# Patient Record
Sex: Female | Born: 1950 | Race: White | Hispanic: No | Smoking: Never smoker
Health system: Southern US, Community
[De-identification: ages and names within clinical notes are randomized; demographics above are authoritative.]

## PROBLEM LIST (undated history)

## (undated) DIAGNOSIS — I10 Essential (primary) hypertension: Secondary | ICD-10-CM

## (undated) DIAGNOSIS — Z87442 Personal history of urinary calculi: Secondary | ICD-10-CM

## (undated) DIAGNOSIS — J302 Other seasonal allergic rhinitis: Secondary | ICD-10-CM

## (undated) DIAGNOSIS — K219 Gastro-esophageal reflux disease without esophagitis: Secondary | ICD-10-CM

## (undated) DIAGNOSIS — R600 Localized edema: Secondary | ICD-10-CM

## (undated) HISTORY — PX: CYSTOURETHROSCOPY: SHX476

## (undated) HISTORY — PX: MELANOMA EXCISION: SHX5266

## (undated) HISTORY — PX: CHOLECYSTECTOMY: SHX55

## (undated) HISTORY — PX: EYE SURGERY: SHX253

## (undated) HISTORY — PX: BLEPHAROPLASTY: SUR158

---

## 2008-09-02 ENCOUNTER — Emergency Department: Payer: Self-pay | Admitting: Internal Medicine

## 2008-09-28 ENCOUNTER — Ambulatory Visit: Payer: Self-pay | Admitting: Internal Medicine

## 2008-10-06 ENCOUNTER — Ambulatory Visit: Payer: Self-pay | Admitting: Internal Medicine

## 2009-04-13 ENCOUNTER — Ambulatory Visit: Payer: Self-pay | Admitting: Internal Medicine

## 2010-05-24 ENCOUNTER — Ambulatory Visit: Payer: Self-pay | Admitting: Internal Medicine

## 2011-01-13 ENCOUNTER — Ambulatory Visit: Payer: Self-pay | Admitting: Internal Medicine

## 2011-06-11 ENCOUNTER — Emergency Department: Payer: Self-pay | Admitting: Emergency Medicine

## 2011-06-17 ENCOUNTER — Ambulatory Visit: Payer: Self-pay | Admitting: Urology

## 2011-06-18 ENCOUNTER — Ambulatory Visit: Payer: Self-pay | Admitting: Urology

## 2011-06-19 ENCOUNTER — Ambulatory Visit: Payer: Self-pay | Admitting: Urology

## 2011-06-20 ENCOUNTER — Ambulatory Visit: Payer: Self-pay | Admitting: Urology

## 2011-06-24 ENCOUNTER — Ambulatory Visit: Payer: Self-pay | Admitting: Urology

## 2011-07-02 ENCOUNTER — Ambulatory Visit: Payer: Self-pay | Admitting: Urology

## 2014-12-13 ENCOUNTER — Ambulatory Visit: Payer: Self-pay | Admitting: Internal Medicine

## 2015-01-01 ENCOUNTER — Ambulatory Visit: Payer: Self-pay | Admitting: Internal Medicine

## 2016-08-15 ENCOUNTER — Other Ambulatory Visit: Payer: Self-pay | Admitting: Family Medicine

## 2016-08-15 DIAGNOSIS — Z780 Asymptomatic menopausal state: Secondary | ICD-10-CM

## 2016-09-08 ENCOUNTER — Ambulatory Visit
Admission: RE | Admit: 2016-09-08 | Discharge: 2016-09-08 | Disposition: A | Discharge status: Home / Self Care | Payer: Medicare Other | Source: Ambulatory Visit | Attending: Family Medicine | Admitting: Family Medicine

## 2016-09-08 ENCOUNTER — Encounter: Payer: Self-pay | Admitting: Radiology

## 2016-09-08 DIAGNOSIS — Z780 Asymptomatic menopausal state: Secondary | ICD-10-CM | POA: Diagnosis present

## 2019-02-22 ENCOUNTER — Other Ambulatory Visit: Payer: Self-pay | Admitting: Medical Oncology

## 2019-02-22 ENCOUNTER — Ambulatory Visit
Admission: RE | Admit: 2019-02-22 | Discharge: 2019-02-22 | Disposition: A | Discharge status: Home / Self Care | Payer: Medicare Other | Source: Ambulatory Visit | Attending: Medical Oncology | Admitting: Medical Oncology

## 2019-02-22 DIAGNOSIS — Z1231 Encounter for screening mammogram for malignant neoplasm of breast: Secondary | ICD-10-CM

## 2020-11-13 DIAGNOSIS — K219 Gastro-esophageal reflux disease without esophagitis: Secondary | ICD-10-CM | POA: Insufficient documentation

## 2022-02-17 ENCOUNTER — Other Ambulatory Visit: Payer: Self-pay | Admitting: Family Medicine

## 2022-02-17 DIAGNOSIS — I10 Essential (primary) hypertension: Secondary | ICD-10-CM | POA: Insufficient documentation

## 2022-02-17 DIAGNOSIS — Z780 Asymptomatic menopausal state: Secondary | ICD-10-CM

## 2022-03-25 ENCOUNTER — Other Ambulatory Visit: Payer: Self-pay | Admitting: Family Medicine

## 2022-03-25 DIAGNOSIS — Z1231 Encounter for screening mammogram for malignant neoplasm of breast: Secondary | ICD-10-CM

## 2022-05-20 ENCOUNTER — Other Ambulatory Visit: Payer: Medicare Other

## 2022-06-04 ENCOUNTER — Ambulatory Visit
Admission: RE | Admit: 2022-06-04 | Discharge: 2022-06-04 | Disposition: A | Discharge status: Home / Self Care | Payer: Medicare Other | Source: Ambulatory Visit | Attending: Family Medicine | Admitting: Family Medicine

## 2022-06-04 DIAGNOSIS — Z1231 Encounter for screening mammogram for malignant neoplasm of breast: Secondary | ICD-10-CM | POA: Insufficient documentation

## 2022-06-04 DIAGNOSIS — Z780 Asymptomatic menopausal state: Secondary | ICD-10-CM | POA: Insufficient documentation

## 2022-09-09 ENCOUNTER — Ambulatory Visit
Admission: EM | Admit: 2022-09-09 | Discharge: 2022-09-09 | Disposition: A | Discharge status: Home / Self Care | Payer: Medicare Other

## 2022-09-09 DIAGNOSIS — L247 Irritant contact dermatitis due to plants, except food: Secondary | ICD-10-CM

## 2022-09-09 MED ORDER — PREDNISONE 10 MG (21) PO TBPK: ORAL_TABLET | ORAL | 0 refills | Status: DC

## 2022-09-09 NOTE — Discharge Instructions (Addendum)
Take the prednisone according to the package instructions.  Use over-the-counter Allegra, Claritin, or Zyrtec during the day as needed for itching and use Benadryl 50 mg at bedtime.  This may also help you sleep as a steroids may interrupt your sleep cycle.  Contact your eye surgeon to let him know you are being put on Prednisone for contact dermatitis around your eye.   Hold your Meloxicam while on Prednisone.  For facial lesions, if you develop any changes in your vision or itching and irritation in your eyes please go to the ER for evaluation or follow-up with ophthalmology.

## 2022-09-09 NOTE — ED Provider Notes (Signed)
[facility]    CSN: [id number] Arrival date & time: 09/09/22  0945      History   Chief Complaint Chief Complaint  Patient presents with   Rash    HPI Autumn Fernandez is a 71 y.o. female.   HPI  71 year old female here for evaluation of rash around her left eye.  Patient reports that she was working in her yard on Saturday when she came in contact with some poison oak or poison ivy.  When she woke up Monday morning she had a rash around her left eye that was itchy.  She states that she then developed swelling to the area above and below her eye.  She was evaluated at [facility] urgent care yesterday and prescribed 1% cortisone cream which has not been helping.  She was also examined by her optometrist yesterday and was told she needs a prednisone pack but he was reluctant to prescribe it as he did not know her past medical history.  Patient is scheduled to have cataract surgery in her left eye on September 24, 2022.  History reviewed. No pertinent past medical history.  There are no problems to display for this patient.   History reviewed. No pertinent surgical history.  OB History   No obstetric history on file.      Home Medications    Prior to Admission medications   Medication Sig Start Date End Date Taking? Authorizing Provider  meloxicam (MOBIC) 15 MG tablet Take 1 tablet by mouth daily. 02/13/21  Yes [provider]  predniSONE (STERAPRED UNI-PAK 21 TAB) 10 MG (21) TBPK tablet Take 6 tablets x 2 days, 5 tablets x 2 days, 4 tablets x 2 days, 3 tablets x 2 days, 2 tablets x 2 days, 1 tablet x 2 days 09/09/22  Yes Margarette Canada, NP    Family History Family History  Problem Relation Age of Onset   Breast cancer Sister     Social History Social History   Tobacco Use   Smoking status: Never   Smokeless tobacco: Never  Vaping Use   Vaping Use: Never used  Substance Use Topics   Alcohol use: Never   Drug use: Never     Allergies   Patient  has no allergy information on record.   Review of Systems Review of Systems  Eyes:  Positive for visual disturbance. Negative for photophobia, pain, discharge, redness and itching.  Skin:  Positive for color change and rash.     Physical Exam Triage Vital Signs ED Triage Vitals  Enc Vitals Group     BP --      Pulse --      Resp --      Temp --      Temp Source 09/09/22 1025 Oral     SpO2 --      Weight 09/09/22 1024 254 lb (115.2 kg)     Height 09/09/22 1024 5' (1.524 m)     Head Circumference --      Peak Flow --      Pain Score 09/09/22 1021 0     Pain Loc --      Pain Edu? --      Excl. in [place]? --    No data found.  Updated Vital Signs BP (!) 159/80 (BP Location: Left Arm)   Resp 20   Ht 5' (1.524 m)   Wt 254 lb (115.2 kg)   BMI 49.61 kg/m   Visual Acuity Right Eye Distance:  20/70 Left Eye Distance: 20/40 Bilateral Distance: 20/25  Right Eye Near:   Left Eye Near:    Bilateral Near:   (Pt wears Prescription Glasses)  Physical Exam Vitals and nursing note reviewed.  Constitutional:      Appearance: Normal appearance. She is not ill-appearing.  HENT:     Head: Normocephalic and atraumatic.  Eyes:     General: No scleral icterus.       Right eye: No discharge.        Left eye: No discharge.     Extraocular Movements: Extraocular movements intact.     Conjunctiva/sclera: Conjunctivae normal.     Pupils: Pupils are equal, round, and reactive to light.  Skin:    General: Skin is warm and dry.     Capillary Refill: Capillary refill takes less than 2 seconds.     Findings: Erythema and rash present.  Neurological:     General: No focal deficit present.     Mental Status: She is alert and oriented to person, place, and time.  Psychiatric:        Mood and Affect: Mood normal.        Behavior: Behavior normal.        Thought Content: Thought content normal.        Judgment: Judgment normal.      UC Treatments / Results  Labs (all labs ordered are  listed, but only abnormal results are displayed) Labs Reviewed - No data to display  EKG   Radiology No results found.  Procedures Procedures (including critical care time)  Medications Ordered in UC Medications - No data to display  Initial Impression / Assessment and Plan / UC Course  I have reviewed the triage vital signs and the nursing notes.  Pertinent labs & imaging results that were available during my care of the patient were reviewed by me and considered in my medical decision making (see chart for details).   Patient is a pleasant, nontoxic-appearing 71 year old female here for evaluation of contact of otitis rash around her left eye that has been present for last 2 days.  She was seen by both [facility] urgent care and her optometrist yesterday, both of which confirmed it was contact dermatitis due to poison oak or poison ivy.  She was prescribed a steroid cream by the urgent care but her optometrist told her that she needs to have oral prednisone but he was reluctant to prescribe it as he did not know her past medical history.    The rash is erythematous base with some mild yellow crusty discharge.  There are some scattered lesions on the left side of her nose and traveling down her left cheek.  There is also swelling to the infraorbital region of her left eye.  Her bulbar and labral conjunctiva are unremarkable.  Her pupils equal round reactive and her EOM is intact.  There are some mild puffiness to her left upper eyelid with some scaly skin but I do not appreciate any lesions or drainage.  Her exam is consistent with contact dermatitis.  I will place her on a 12-day prednisone taper.  I have advised her that she needs to contact her ophthalmologist in [place] who will be doing her cataract surgery to let her know that she has contact dermatitis around her eye and is going to be on steroids.  I have also advised her that if she develops any changes in her vision that she needs to  see her eye  doctor for reevaluation.  Her visual acuity is OU 20/25, OD 20/70, OS 20/40.   Final Clinical Impressions(s) / UC Diagnoses   Final diagnoses:  Irritant contact dermatitis due to plants, except food     Discharge Instructions      Take the prednisone according to the package instructions.  Use over-the-counter Allegra, Claritin, or Zyrtec during the day as needed for itching and use Benadryl 50 mg at bedtime.  This may also help you sleep as a steroids may interrupt your sleep cycle.  Contact your eye surgeon to let him know you are being put on Prednisone for contact dermatitis around your eye.   Hold your Meloxicam while on Prednisone.  For facial lesions, if you develop any changes in your vision or itching and irritation in your eyes please go to the ER for evaluation or follow-up with ophthalmology.      ED Prescriptions     Medication Sig Dispense Auth. Provider   predniSONE (STERAPRED UNI-PAK 21 TAB) 10 MG (21) TBPK tablet Take 6 tablets x 2 days, 5 tablets x 2 days, 4 tablets x 2 days, 3 tablets x 2 days, 2 tablets x 2 days, 1 tablet x 2 days 42 tablet Becky Augusta, NP      PDMP not reviewed this encounter.   Becky Augusta, NP 09/09/22 1050

## 2022-09-09 NOTE — ED Triage Notes (Signed)
Pt c/o rash x2days. Pt was working outside Saturday and believes that she got it then.   Pt went to a walk in clinic in [place] and was told that it is Edema and that she needs to use Cortisone Cream. Pt states that that did not help the itching.   Pt went to her eye doctor for blurry vision and was told that she has [facility] along the eye but they refuse to prescribe steroids due to lack of medical history.

## 2023-06-17 ENCOUNTER — Other Ambulatory Visit: Payer: Self-pay

## 2023-06-17 ENCOUNTER — Encounter (HOSPITAL_BASED_OUTPATIENT_CLINIC_OR_DEPARTMENT_OTHER): Payer: Self-pay | Admitting: Ophthalmology

## 2023-06-18 ENCOUNTER — Encounter (HOSPITAL_BASED_OUTPATIENT_CLINIC_OR_DEPARTMENT_OTHER)
Admission: RE | Admit: 2023-06-18 | Discharge: 2023-06-18 | Disposition: A | Discharge status: Home / Self Care | Payer: Medicare Other | Source: Ambulatory Visit | Attending: Ophthalmology | Admitting: Ophthalmology

## 2023-06-18 DIAGNOSIS — Z01810 Encounter for preprocedural cardiovascular examination: Secondary | ICD-10-CM | POA: Diagnosis not present

## 2023-06-18 DIAGNOSIS — I10 Essential (primary) hypertension: Secondary | ICD-10-CM | POA: Diagnosis not present

## 2023-06-18 NOTE — Progress Notes (Signed)
BMI 51.84, Anesthesia consult per Dr. Stephannie Peters, will proceed with surgery as scheduled.

## 2023-06-18 NOTE — Anesthesia Preprocedure Evaluation (Addendum)
Anesthesia Evaluation  Patient identified by MRN, date of birth, ID band Patient awake    Reviewed: Allergy & Precautions, NPO status , Patient's Chart, lab work & pertinent test results  History of Anesthesia Complications Negative for: history of anesthetic complications  Airway Mallampati: II  TM Distance: >3 FB Neck ROM: Full    Dental  (+) Chipped, Missing,    Pulmonary neg pulmonary ROS   Pulmonary exam normal        Cardiovascular hypertension, Normal cardiovascular exam     Neuro/Psych negative neurological ROS  negative psych ROS   GI/Hepatic Neg liver ROS,GERD  Medicated and Controlled,,  Endo/Other  negative endocrine ROS    Renal/GU negative Renal ROS     Musculoskeletal negative musculoskeletal ROS (+)    Abdominal   Peds  Hematology negative hematology ROS (+)   Anesthesia Other Findings EXOTROPIA  Reproductive/Obstetrics                             Anesthesia Physical Anesthesia Plan  ASA: 3  Anesthesia Plan: General   Post-op Pain Management:    Induction: Intravenous  PONV Risk Score and Plan: 3 and Ondansetron, Dexamethasone, Midazolam and Treatment may vary due to age or medical condition  Airway Management Planned: LMA  Additional Equipment:   Intra-op Plan:   Post-operative Plan: Extubation in OR  Informed Consent: I have reviewed the patients History and Physical, chart, labs and discussed the procedure including the risks, benefits and alternatives for the proposed anesthesia with the patient or authorized representative who has indicated his/her understanding and acceptance.     Dental advisory given  Plan Discussed with: CRNA  Anesthesia Plan Comments: (Patient seen in preop for consult due to BMI. Airway looks reassuring. Anesthesia explained in general terms. I explained that she would meet her anesthesiologist on day of surgery and discuss  anesthetic plan in more detail. All questions answered. Stephannie Peters, MD)       Anesthesia Quick Evaluation

## 2023-06-24 ENCOUNTER — Ambulatory Visit: Payer: Self-pay | Admitting: Ophthalmology

## 2023-06-24 NOTE — H&P (View-Only) (Signed)
Date of examination:  06/08/23  Indication for surgery: recurrent right exotropia  Pertinent past medical history:  Past Medical History:  Diagnosis Date   GERD (gastroesophageal reflux disease)    History of kidney stones    Hypertension    Peripheral edema    Seasonal allergies     Pertinent ocular history:  History of right esotropia as child with surgical repair at least once. Patient unsure of procedure performed. Now a large-angle exotropia that is bothersome to patient.  Pertinent family history:  Family History  Problem Relation Age of Onset   Breast cancer Sister     General:  Healthy appearing patient in no distress.    Eyes:    Acuity OD 20/60  OS 20/25  cc   External: Within normal limits     Anterior segment: arcus OU; IOLs OU  Motility:   60pd RXT; no stereopsis  Impression: 71yo female with history of RET and surgical repair, now with large RXT  Plan: Will do maximal correction of right eye; patient aware may need muscle operated on the left if maximal work already performed on right eye; hopeful to perform RMR advancement and maximal RLRc  M. Grace Kaydense Rizo, MD   

## 2023-06-24 NOTE — H&P (Signed)
Date of examination:  06/08/23  Indication for surgery: recurrent right exotropia  Pertinent past medical history:  Past Medical History:  Diagnosis Date   GERD (gastroesophageal reflux disease)    History of kidney stones    Hypertension    Peripheral edema    Seasonal allergies     Pertinent ocular history:  History of right esotropia as child with surgical repair at least once. Patient unsure of procedure performed. Now a large-angle exotropia that is bothersome to patient.  Pertinent family history:  Family History  Problem Relation Age of Onset   Breast cancer Sister     General:  Healthy appearing patient in no distress.    Eyes:    Acuity OD 20/60  OS 20/25  cc   External: Within normal limits     Anterior segment: arcus OU; IOLs OU  Motility:   60pd RXT; no stereopsis  Impression: 72yo female with history of RET and surgical repair, now with large RXT  Plan: Will do maximal correction of right eye; patient aware may need muscle operated on the left if maximal work already performed on right eye; hopeful to perform RMR advancement and maximal RLRc  M. Rodman Pickle, MD

## 2023-06-26 ENCOUNTER — Ambulatory Visit (HOSPITAL_BASED_OUTPATIENT_CLINIC_OR_DEPARTMENT_OTHER)
Admission: RE | Admit: 2023-06-26 | Discharge: 2023-06-26 | Disposition: A | Discharge status: Home / Self Care | Payer: Medicare Other | Source: Ambulatory Visit | Attending: Ophthalmology | Admitting: Ophthalmology

## 2023-06-26 ENCOUNTER — Other Ambulatory Visit: Payer: Self-pay

## 2023-06-26 ENCOUNTER — Ambulatory Visit (HOSPITAL_BASED_OUTPATIENT_CLINIC_OR_DEPARTMENT_OTHER): Payer: Medicare Other | Admitting: Anesthesiology

## 2023-06-26 ENCOUNTER — Encounter (HOSPITAL_BASED_OUTPATIENT_CLINIC_OR_DEPARTMENT_OTHER): Payer: Self-pay | Admitting: Ophthalmology

## 2023-06-26 ENCOUNTER — Encounter (HOSPITAL_BASED_OUTPATIENT_CLINIC_OR_DEPARTMENT_OTHER)
Admission: RE | Disposition: A | Discharge status: Home / Self Care | Payer: Self-pay | Source: Ambulatory Visit | Attending: Ophthalmology

## 2023-06-26 DIAGNOSIS — H5000 Unspecified esotropia: Secondary | ICD-10-CM | POA: Diagnosis not present

## 2023-06-26 DIAGNOSIS — I10 Essential (primary) hypertension: Secondary | ICD-10-CM | POA: Diagnosis not present

## 2023-06-26 DIAGNOSIS — H53001 Unspecified amblyopia, right eye: Secondary | ICD-10-CM | POA: Diagnosis not present

## 2023-06-26 DIAGNOSIS — H50111 Monocular exotropia, right eye: Secondary | ICD-10-CM | POA: Diagnosis not present

## 2023-06-26 DIAGNOSIS — H5334 Suppression of binocular vision: Secondary | ICD-10-CM | POA: Diagnosis not present

## 2023-06-26 DIAGNOSIS — Z01818 Encounter for other preprocedural examination: Secondary | ICD-10-CM

## 2023-06-26 HISTORY — DX: Essential (primary) hypertension: I10

## 2023-06-26 HISTORY — DX: Other seasonal allergic rhinitis: J30.2

## 2023-06-26 HISTORY — DX: Gastro-esophageal reflux disease without esophagitis: K21.9

## 2023-06-26 HISTORY — DX: Personal history of urinary calculi: Z87.442

## 2023-06-26 HISTORY — PX: STRABISMUS SURGERY: SHX218

## 2023-06-26 HISTORY — DX: Localized edema: R60.0

## 2023-06-26 SURGERY — REPAIR STRABISMUS
Anesthesia: General | Site: Eye | Laterality: Right

## 2023-06-26 MED ORDER — LIDOCAINE 2% (20 MG/ML) 5 ML SYRINGE
INTRAMUSCULAR | Status: DC | PRN
  Administered 2023-06-26: 100 mg via INTRAVENOUS

## 2023-06-26 MED ORDER — BUPIVACAINE HCL (PF) 0.5 % IJ SOLN
INTRAMUSCULAR | Status: AC
  Filled 2023-06-26: qty 30

## 2023-06-26 MED ORDER — PHENYLEPHRINE HCL 2.5 % OP SOLN
OPHTHALMIC | Status: DC | PRN
  Administered 2023-06-26: 2 [drp]

## 2023-06-26 MED ORDER — AMISULPRIDE (ANTIEMETIC) 5 MG/2ML IV SOLN: 10.0000 mg | Freq: Once | INTRAVENOUS | Status: DC | PRN

## 2023-06-26 MED ORDER — NEOMYCIN-POLYMYXIN-DEXAMETH 3.5-10000-0.1 OP OINT
TOPICAL_OINTMENT | OPHTHALMIC | Status: AC
  Filled 2023-06-26: qty 3.5

## 2023-06-26 MED ORDER — FENTANYL CITRATE (PF) 100 MCG/2ML IJ SOLN
INTRAMUSCULAR | Status: DC | PRN
  Administered 2023-06-26: 25 ug via INTRAVENOUS
  Administered 2023-06-26: 50 ug via INTRAVENOUS
  Administered 2023-06-26: 25 ug via INTRAVENOUS

## 2023-06-26 MED ORDER — ONDANSETRON HCL 4 MG/2ML IJ SOLN: 4.0000 mg | Freq: Once | INTRAMUSCULAR | Status: DC | PRN

## 2023-06-26 MED ORDER — PROPOFOL 10 MG/ML IV BOLUS
INTRAVENOUS | Status: DC | PRN
  Administered 2023-06-26: 200 mg via INTRAVENOUS

## 2023-06-26 MED ORDER — DEXAMETHASONE SODIUM PHOSPHATE 4 MG/ML IJ SOLN
INTRAMUSCULAR | Status: DC | PRN
  Administered 2023-06-26: 5 mg via INTRAVENOUS

## 2023-06-26 MED ORDER — ACETAMINOPHEN 500 MG PO TABS: 1000.0000 mg | ORAL_TABLET | Freq: Once | ORAL | Status: DC

## 2023-06-26 MED ORDER — PHENYLEPHRINE 80 MCG/ML (10ML) SYRINGE FOR IV PUSH (FOR BLOOD PRESSURE SUPPORT)
PREFILLED_SYRINGE | INTRAVENOUS | Status: AC
  Filled 2023-06-26: qty 10

## 2023-06-26 MED ORDER — LACTATED RINGERS IV SOLN: INTRAVENOUS | Status: DC

## 2023-06-26 MED ORDER — KETOROLAC TROMETHAMINE 15 MG/ML IJ SOLN: 15.0000 mg | Freq: Once | INTRAMUSCULAR | Status: DC | PRN

## 2023-06-26 MED ORDER — SUCCINYLCHOLINE CHLORIDE 200 MG/10ML IV SOSY
PREFILLED_SYRINGE | INTRAVENOUS | Status: AC
  Filled 2023-06-26: qty 10

## 2023-06-26 MED ORDER — BSS IO SOLN
INTRAOCULAR | Status: AC
  Filled 2023-06-26: qty 15

## 2023-06-26 MED ORDER — PHENYLEPHRINE HCL 2.5 % OP SOLN
OPHTHALMIC | Status: AC
  Filled 2023-06-26: qty 2

## 2023-06-26 MED ORDER — MIDAZOLAM HCL 2 MG/2ML IJ SOLN
INTRAMUSCULAR | Status: AC
  Filled 2023-06-26: qty 2

## 2023-06-26 MED ORDER — ONDANSETRON HCL 4 MG/2ML IJ SOLN
INTRAMUSCULAR | Status: AC
  Filled 2023-06-26: qty 2

## 2023-06-26 MED ORDER — TOBRAMYCIN-DEXAMETHASONE 0.3-0.1 % OP SUSP
OPHTHALMIC | Status: DC | PRN
  Administered 2023-06-26: 3 [drp] via OPHTHALMIC

## 2023-06-26 MED ORDER — LIDOCAINE 2% (20 MG/ML) 5 ML SYRINGE
INTRAMUSCULAR | Status: AC
  Filled 2023-06-26: qty 5

## 2023-06-26 MED ORDER — EPHEDRINE 5 MG/ML INJ
INTRAVENOUS | Status: AC
  Filled 2023-06-26: qty 5

## 2023-06-26 MED ORDER — TOBRAMYCIN-DEXAMETHASONE 0.3-0.1 % OP SUSP
OPHTHALMIC | Status: AC
  Filled 2023-06-26: qty 2.5

## 2023-06-26 MED ORDER — TOBRAMYCIN-DEXAMETHASONE 0.3-0.1 % OP SUSP: 1.0000 [drp] | Freq: Four times a day (QID) | OPHTHALMIC | 0 refills | Status: AC

## 2023-06-26 MED ORDER — BSS IO SOLN
INTRAOCULAR | Status: DC | PRN
  Administered 2023-06-26: 30 mL

## 2023-06-26 MED ORDER — NEOMYCIN-POLYMYXIN-DEXAMETH 0.1 % OP OINT
TOPICAL_OINTMENT | OPHTHALMIC | Status: DC | PRN
  Administered 2023-06-26: 1 via OPHTHALMIC

## 2023-06-26 MED ORDER — ACETAMINOPHEN 10 MG/ML IV SOLN: 1000.0000 mg | Freq: Once | INTRAVENOUS | Status: DC | PRN

## 2023-06-26 MED ORDER — FENTANYL CITRATE (PF) 100 MCG/2ML IJ SOLN: 25.0000 ug | INTRAMUSCULAR | Status: DC | PRN

## 2023-06-26 MED ORDER — BUPIVACAINE HCL 0.5 % IJ SOLN
INTRAMUSCULAR | Status: DC | PRN
  Administered 2023-06-26: 1.5 mL

## 2023-06-26 MED ORDER — DEXAMETHASONE SODIUM PHOSPHATE 10 MG/ML IJ SOLN
INTRAMUSCULAR | Status: AC
  Filled 2023-06-26: qty 1

## 2023-06-26 MED ORDER — FENTANYL CITRATE (PF) 100 MCG/2ML IJ SOLN
INTRAMUSCULAR | Status: AC
  Filled 2023-06-26: qty 2

## 2023-06-26 MED ORDER — KETOROLAC TROMETHAMINE 30 MG/ML IJ SOLN
INTRAMUSCULAR | Status: AC
  Filled 2023-06-26: qty 1

## 2023-06-26 MED ORDER — ATROPINE SULFATE 0.4 MG/ML IV SOLN
INTRAVENOUS | Status: AC
  Filled 2023-06-26: qty 1

## 2023-06-26 MED ORDER — KETOROLAC TROMETHAMINE 15 MG/ML IJ SOLN
INTRAMUSCULAR | Status: DC | PRN
  Administered 2023-06-26: 15 mg via INTRAVENOUS

## 2023-06-26 SURGICAL SUPPLY — 37 items
APL SRG 3 HI ABS STRL LF PLS (MISCELLANEOUS) ×2
APL SWBSTK 6 STRL LF DISP (MISCELLANEOUS) ×3
APPLICATOR COTTON TIP 6 STRL (MISCELLANEOUS) ×4 IMPLANT
APPLICATOR COTTON TIP 6IN STRL (MISCELLANEOUS) ×3
APPLICATOR DR MATTHEWS STRL (MISCELLANEOUS) ×1 IMPLANT
BNDG EYE OVAL 2 1/8 X 2 5/8 (GAUZE/BANDAGES/DRESSINGS) IMPLANT
CAUTERY EYE LOW TEMP 1300F FIN (OPHTHALMIC RELATED) IMPLANT
CORD BIPOLAR FORCEPS 12FT (ELECTRODE) ×1 IMPLANT
COVER BACK TABLE 60X90IN (DRAPES) ×1 IMPLANT
COVER MAYO STAND STRL (DRAPES) ×1 IMPLANT
DRAPE SURG 17X23 STRL (DRAPES) ×1 IMPLANT
DRAPE U-SHAPE 76X120 STRL (DRAPES) ×1 IMPLANT
GLOVE BIO SURGEON STRL SZ 6.5 (GLOVE) ×1 IMPLANT
GLOVE BIO SURGEON STRL SZ7 (GLOVE) ×1 IMPLANT
GLOVE BIOGEL PI IND STRL 7.0 (GLOVE) IMPLANT
GLOVE BIOGEL PI IND STRL 7.5 (GLOVE) IMPLANT
GLOVE SURG SYN 7.5 E (GLOVE) ×1 IMPLANT
GLOVE SURG SYN 7.5 PF PI (GLOVE) IMPLANT
GOWN STRL REUS W/ TWL LRG LVL3 (GOWN DISPOSABLE) ×2 IMPLANT
GOWN STRL REUS W/ TWL XL LVL3 (GOWN DISPOSABLE) IMPLANT
GOWN STRL REUS W/TWL LRG LVL3 (GOWN DISPOSABLE) ×1
GOWN STRL REUS W/TWL XL LVL3 (GOWN DISPOSABLE) ×1
NS IRRIG 1000ML POUR BTL (IV SOLUTION) ×1 IMPLANT
PACK BASIN DAY SURGERY FS (CUSTOM PROCEDURE TRAY) ×1 IMPLANT
SHEILD EYE MED CORNL SHD 22X21 (OPHTHALMIC RELATED)
SHIELD EYE MED CORNL SHD 22X21 (OPHTHALMIC RELATED) IMPLANT
SPEAR EYE SURG WECK-CEL (MISCELLANEOUS) ×1 IMPLANT
STRIP CLOSURE SKIN 1/4X4 (GAUZE/BANDAGES/DRESSINGS) IMPLANT
SUT 6 0 SILK T G140 8DA (SUTURE) IMPLANT
SUT CHROMIC 7 0 TG140 8 (SUTURE) ×1 IMPLANT
SUT SILK 4 0 C 3 735G (SUTURE) IMPLANT
SUT VICRYL 6 0 S 28 (SUTURE) IMPLANT
SUT VICRYL ABS 6-0 S29 18IN (SUTURE) IMPLANT
SYR 10ML LL (SYRINGE) ×1 IMPLANT
SYR 3ML 18GX1 1/2 (SYRINGE) ×2 IMPLANT
TOWEL GREEN STERILE FF (TOWEL DISPOSABLE) ×1 IMPLANT
TRAY DSU PREP LF (CUSTOM PROCEDURE TRAY) ×1 IMPLANT

## 2023-06-26 NOTE — Discharge Instructions (Addendum)
Diet: Clear liquids, advance to soft foods then regular diet as tolerated.  Pain control: Overlapping ibuprofen (aka Advil, Motrin) with acetaminophen (aka Tylenol, Excedrin) has been clinically proven as effective for pain as morphine.  1)  Ibuprofen 600 mg by mouth every 6 hours as needed for pain   Do not take this medication if you already take NSAIDs (such as naproxen/Aleve or Surveyor, quantity).  2)  Acetaminophen 325 one or two by mouth every 4-6 hours as needed for pain that is not resolved by ibuprofen   Do not take this medication if you already take acetaminophen within another medication (such as Percocet/Lortab).  Eye medications:  Antibiotic eye drops or ointment, one drop or application in the operated eye(s) 4 times a day for 10 days.    Activity: No swimming for 1 week. It is OK to let water run over the face and eyes while showering or taking a bath, even during the first week.  No other restriction on exercise or activity.  Eye movement: The eyes may look very slightly crossed in or turned out, and you may experience temporary double vision (diplopia). This is not unusual postoperatively and may happen up to two months after surgery while the muscles are healing. The eyes may be tired during the first few weeks after surgery; reading can be uncomfortable during the healing process but will not hurt the eyes.  Call Dr. Eliane Decree office [phone] with any problems or concerns.  May take NSAIDS (ibuprofen/motrin) after 4pm, if needed.    Post Anesthesia Home Care Instructions  Activity: Get plenty of rest for the remainder of the day. A responsible individual must stay with you for 24 hours following the procedure.  For the next 24 hours, DO NOT: -Drive a car -Advertising copywriter -Drink alcoholic beverages -Take any medication unless instructed by your physician -Make any legal decisions or sign important papers.  Meals: Start with liquid foods such as gelatin or  soup. Progress to regular foods as tolerated. Avoid greasy, spicy, heavy foods. If nausea and/or vomiting occur, drink only clear liquids until the nausea and/or vomiting subsides. Call your physician if vomiting continues.  Special Instructions/Symptoms: Your throat may feel dry or sore from the anesthesia or the breathing tube placed in your throat during surgery. If this causes discomfort, gargle with warm salt water. The discomfort should disappear within 24 hours.  If you had a scopolamine patch placed behind your ear for the management of post- operative nausea and/or vomiting:  1. The medication in the patch is effective for 72 hours, after which it should be removed.  Wrap patch in a tissue and discard in the trash. Wash hands thoroughly with soap and water. 2. You may remove the patch earlier than 72 hours if you experience unpleasant side effects which may include dry mouth, dizziness or visual disturbances. 3. Avoid touching the patch. Wash your hands with soap and water after contact with the patch.

## 2023-06-26 NOTE — Anesthesia Procedure Notes (Signed)
Procedure Name: LMA Insertion Date/Time: 06/26/2023 9:58 AM  Performed by: Ronnette Hila, CRNAPre-anesthesia Checklist: Patient identified, Emergency Drugs available, Suction available and Patient being monitored Patient Re-evaluated:Patient Re-evaluated prior to induction Oxygen Delivery Method: Circle system utilized Preoxygenation: Pre-oxygenation with 100% oxygen Induction Type: IV induction Ventilation: Mask ventilation without difficulty LMA: LMA inserted LMA Size: 4.0 Number of attempts: 1 Airway Equipment and Method: Bite block Placement Confirmation: positive ETCO2 Tube secured with: Tape Dental Injury: Teeth and Oropharynx as per pre-operative assessment

## 2023-06-26 NOTE — Op Note (Signed)
06/26/2023  10:55 AM  PATIENT:  Autumn Fernandez  72 y.o. 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 Large angle right exotropia 2. Amblyopia right eye 3. Binocular suppression right eye 4. History of right eye surgery for esotropia in early childhood 5. 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  Right lateral rectus muscle recession 10mm     2.  Right medial rectus advancement to original insertion from 5.4mm posterior to insertion    3. Clearance of scar tissue as necessary  SURGEON: Despina Hidden, M.D.   ANESTHESIA:  General LMA and subTenons Marcaine  COMPLICATIONS: None immediate  DESCRIPTION OF PROCEDURE: The patient was taken to the operating room where She was identified by me. General anesthesia was induced without difficulty after placement of appropriate monitors. The patient was prepped and draped in standard sterile fashion. A lid speculum was placed in the right eye. Forced ductions were remarkable for some restriction of medial movement.  Through an inferotemporal fornix incision through conjunctiva and Tenon's fascia, the right lateral rectus muscle was engaged on a series of muscle hooks and cleared of its fascial attachments. The tendon was secured with a double-armed 6-0 Vicryl suture with a double locking bite at each border of the muscle, 1 mm from the insertion. The muscle was disinserted, and was reattached to sclera at a measured distance of 10 millimeters posterior to the original insertion, using direct scleral passes in crossed swords fashion.  The suture ends were tied securely after the position of the muscle had been checked and found to be accurate. Conjunctiva was closed with 4 7-0 Chromic sutures.  Attention was turned to the medial rectus muscle. An  incision just inferior to the original insertion location was made through conjunctiva and Tenons. A cleanly attached muscle belly was located 10.78mm posterior to the limbus; the original insertion was found and marked. The right medial rectus muscle was engaged on a series of muscle hooks and cleared of its fascial attachments and scar. The tendon was secured with a double-armed 6-0 Vicryl suture with a double locking bite at each border of the muscle, 1 mm from the insertion. The muscle was disinserted, and was gently brought forward to the original insertion; it was reattached using direct scleral passes in crossed swords fashion.  The suture ends were tied securely after the position of the muscle had been checked and found to be accurate. Conjunctiva was closed with 4 7-0 Chromic sutures.  1.66mL of plain marcaine was instilled into the inferotemporal subTenon's space for postoperative anesthesia. Tobradex drops were placed in the right eye. The patient was awakened without difficulty and taken to the recovery room in stable condition, having suffered no intraoperative or immediate postoperative complications.  Despina Hidden, M.D.

## 2023-06-26 NOTE — Transfer of Care (Signed)
Immediate Anesthesia Transfer of Care Note  Patient: Autumn Fernandez  Procedure(s) Performed: STRABISMUS REPAIR RIGHT EYE (Right: Eye)  Patient Location: PACU  Anesthesia Type:General  Level of Consciousness: awake, drowsy, and patient cooperative  Airway & Oxygen Therapy: Patient Spontanous Breathing and Patient connected to face mask oxygen  Post-op Assessment: Report given to RN and Post -op Vital signs reviewed and stable  Post vital signs: Reviewed and stable  Last Vitals:  Vitals Value Taken Time  BP    Temp    Pulse    Resp    SpO2      Last Pain:  Vitals:   06/26/23 0849  TempSrc: Temporal  PainSc: 5       Patients Stated Pain Goal: 5 (06/26/23 0849)  Complications: No notable events documented.

## 2023-06-26 NOTE — Anesthesia Postprocedure Evaluation (Signed)
Anesthesia Post Note  Patient: Autumn Fernandez  Procedure(s) Performed: STRABISMUS REPAIR RIGHT EYE (Right: Eye)     Patient location during evaluation: PACU Anesthesia Type: General Level of consciousness: awake Pain management: pain level controlled Vital Signs Assessment: post-procedure vital signs reviewed and stable Respiratory status: spontaneous breathing, nonlabored ventilation and respiratory function stable Cardiovascular status: blood pressure returned to baseline and stable Postop Assessment: no apparent nausea or vomiting Anesthetic complications: no   No notable events documented.  Last Vitals:  Vitals:   06/26/23 1130 06/26/23 1145  BP: (!) 144/80 (!) 157/79  Pulse: 70 72  Resp: 20 18  Temp:  36.7 C  SpO2: 94% 93%    Last Pain:  Vitals:   06/26/23 1145  TempSrc:   PainSc: 0-No pain                 Tarren Velardi P Anthoni Geerts

## 2023-06-26 NOTE — Interval H&P Note (Signed)
History and Physical Interval Note:  06/26/2023 9:20 AM  Glade Stanford  has presented today for surgery, with the diagnosis of EXOTROPIA.  The various methods of treatment have been discussed with the patient and family. After consideration of risks, benefits and other options for treatment, the patient has consented to  Procedure(s): REPAIR STRABISMUS RIGHT EYE (Right) as a surgical intervention.  The patient's history has been reviewed, patient examined, no change in status, stable for surgery.  I have reviewed the patient's chart and labs.  Questions were answered to the patient's satisfaction.     French Ana

## 2023-06-29 ENCOUNTER — Encounter (HOSPITAL_BASED_OUTPATIENT_CLINIC_OR_DEPARTMENT_OTHER): Payer: Self-pay | Admitting: Ophthalmology

## 2023-10-26 IMAGING — MG MM DIGITAL SCREENING BILAT W/ TOMO AND CAD
8 of 14 series · 8 of 40 positions shown · non-contrast
Comparison: Previous exam(s).

ACR Breast Density Category a: The breast tissue is almost entirely
fatty.

CLINICAL DATA: Screening.

EXAM:
DIGITAL SCREENING BILATERAL MAMMOGRAM WITH TOMOSYNTHESIS AND CAD
TECHNIQUE: Bilateral screening digital craniocaudal and mediolateral oblique
mammograms were obtained. Bilateral screening digital breast
tomosynthesis was performed. The images were evaluated with
computer-aided detection.

[L MLO synth-2D (1 of 2)]
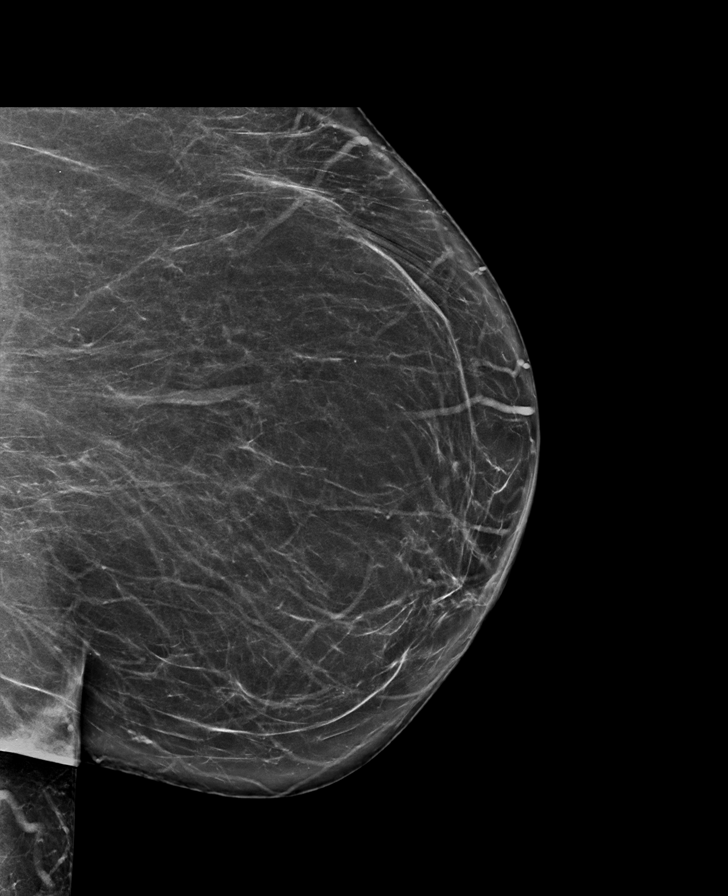

[L XCCM synth-2D]
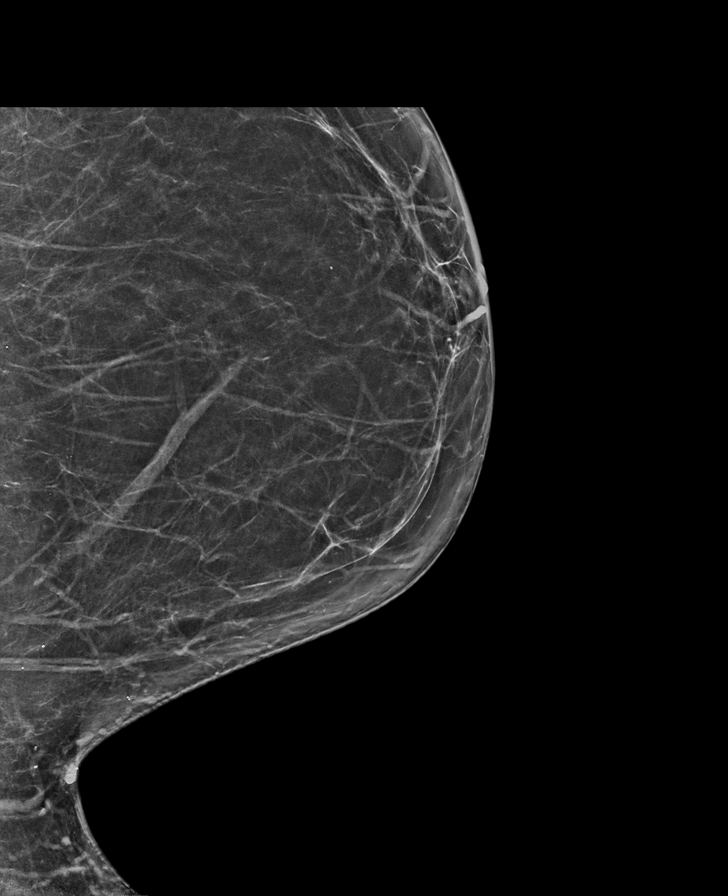

[R XCCL synth-2D]
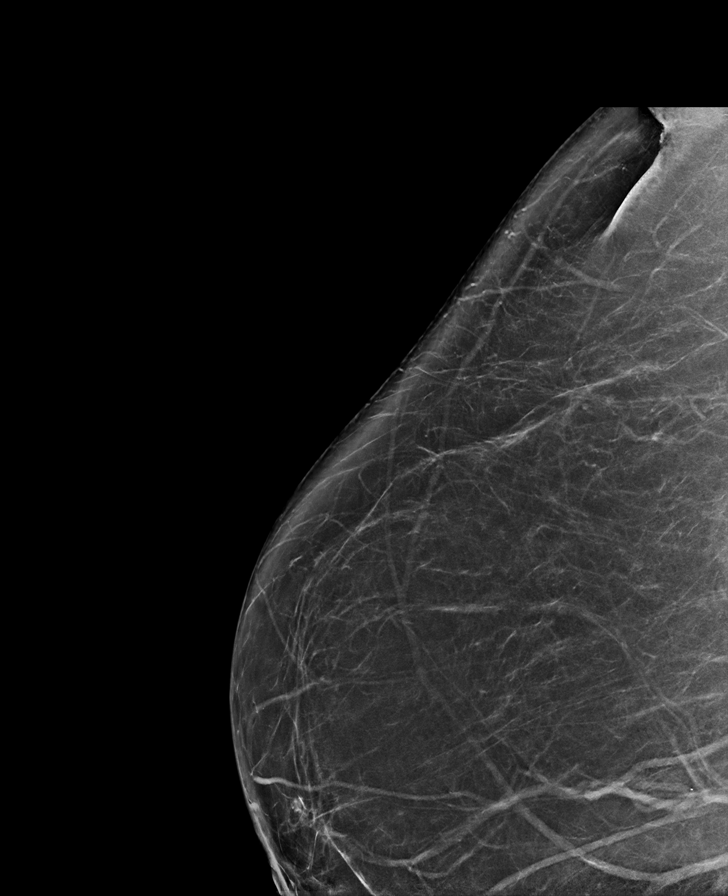

[L MLO synth-2D (2 of 2)]
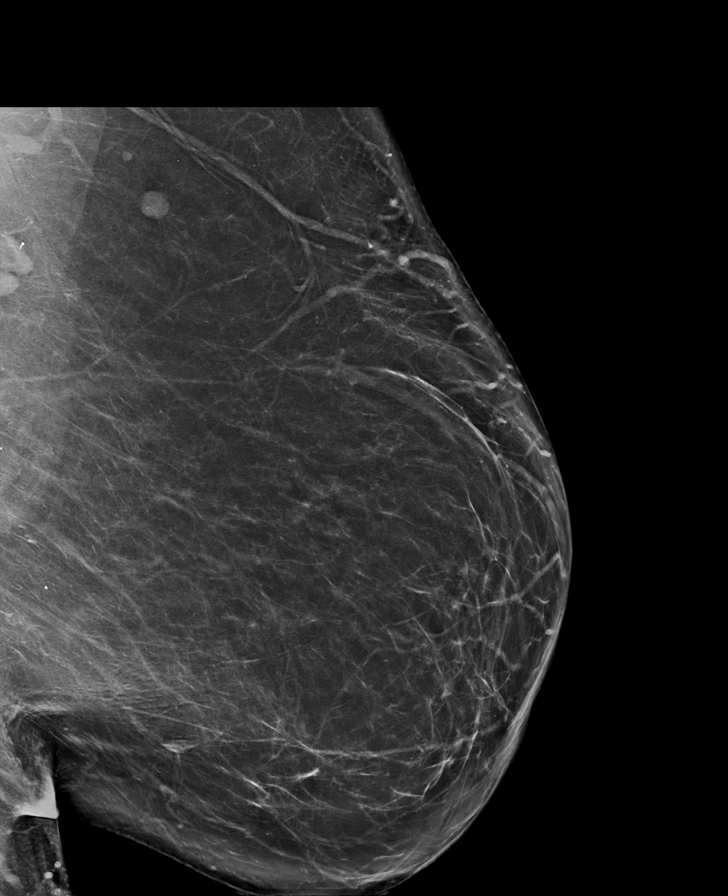

[R CC synth-2D]
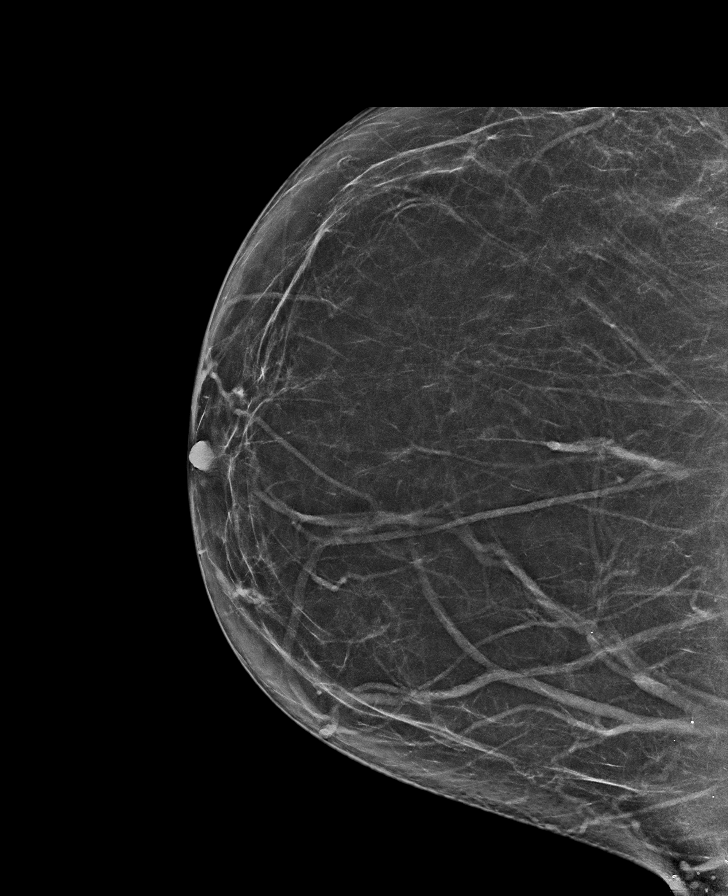

[R MLO synth-2D]
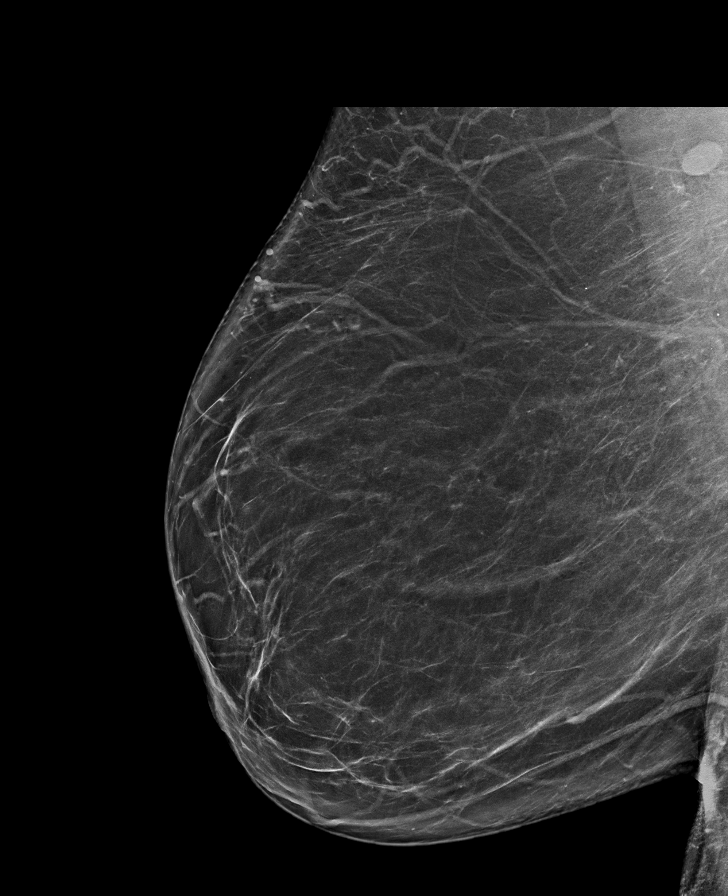

[L CC synth-2D]
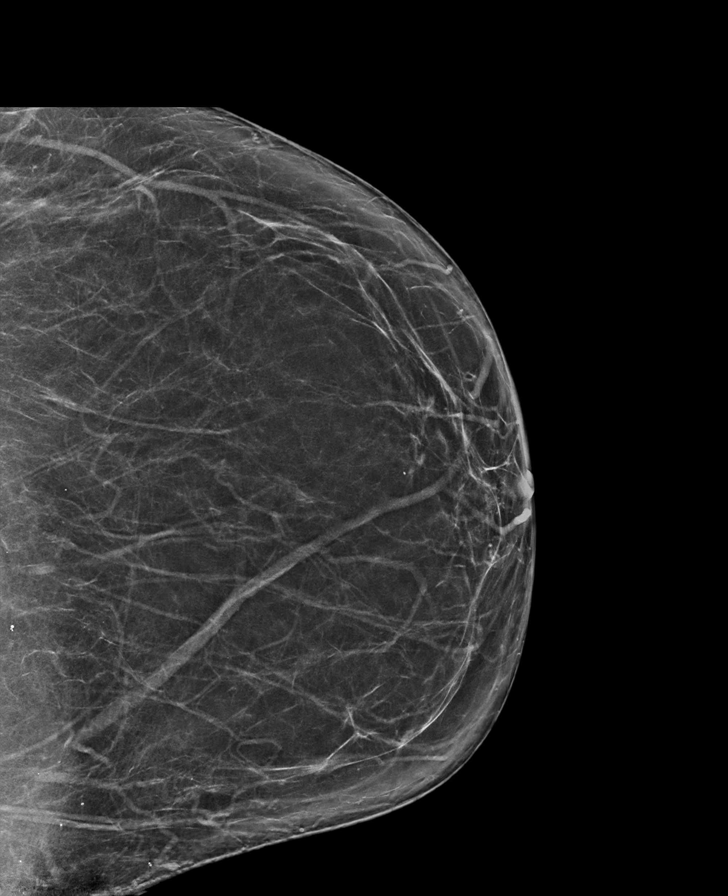

[L MLO tomo · tomo slice 43/86.0]
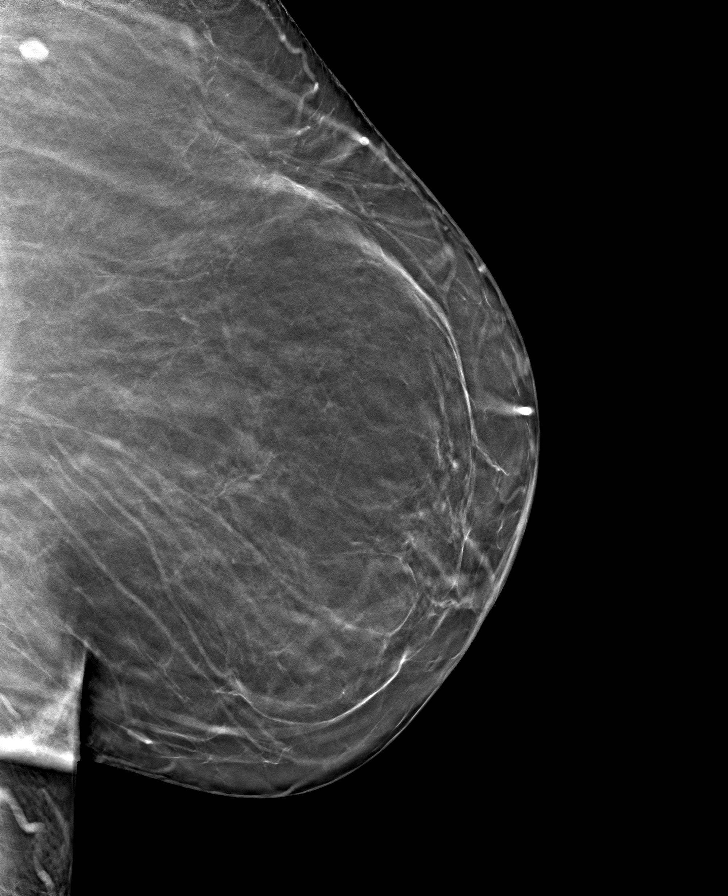

[8 of 40 positions shown; findings below may reference images not displayed]

FINDINGS: There are no findings suspicious for malignancy.
IMPRESSION: No mammographic evidence of malignancy. A result letter of this
screening mammogram will be mailed directly to the patient.

RECOMMENDATION:
Screening mammogram in one year. (Code:0E-3-N98)

BI-RADS CATEGORY  1: Negative.

## 2024-06-13 ENCOUNTER — Other Ambulatory Visit: Payer: Self-pay | Admitting: Physician Assistant

## 2024-06-13 DIAGNOSIS — Z1231 Encounter for screening mammogram for malignant neoplasm of breast: Secondary | ICD-10-CM

## 2024-10-06 ENCOUNTER — Ambulatory Visit
Admission: RE | Admit: 2024-10-06 | Discharge: 2024-10-06 | Disposition: A | Discharge status: Home / Self Care | Source: Ambulatory Visit | Attending: Physician Assistant | Admitting: Physician Assistant

## 2024-10-06 DIAGNOSIS — Z1231 Encounter for screening mammogram for malignant neoplasm of breast: Secondary | ICD-10-CM | POA: Diagnosis present

## 2024-12-24 NOTE — Progress Notes (Signed)
 Sleep Medicine   Office Visit  Patient Name: Autumn Fernandez DOB: August 29, 1951 MRN [id number]    Chief Complaint: OSA   Brief History:  Autumn Fernandez presents for an initial consult to establish care after set up on CPAP@ 12 cmH20. Patient has a 6 month history of diagnosed sleep apnea. Prior to CPAP, patient reports symptoms of: witnessed apnea, EDS and low energy. Patient reports her sleep quality is good. This is noted most nights.The patient relates the following symptoms: oral dryness. Humidity working well but to supplement suggested Xylimelt tablets or Biotene rinse. The patient goes to sleep at 11:00pm and wakes up at 7:30am. Patient has noted restlessness of her legs at night, but this has improved since being on cpap.   The patient  relates no unusual behavior during the night.  The patient denies a history of psychiatric problems. The Epworth Sleepiness Score is 5 out of 24 .  The compliance download shows  87% compliance with an average use time of 6 hours 24 minutes. The AHI is 3.5 The patient relates  Cardiovascular risk factors include: history of hypertension.  The patient reports some pain at night that disrupts sleep.     ROS  General: (-) fever, (-) chills, (-) night sweat Nose and Sinuses: (-) nasal stuffiness or itchiness, (-) postnasal drip, (-) nosebleeds, (-) sinus trouble. Mouth and Throat: (-) sore throat, (-) hoarseness. Neck: (-) swollen glands, (-) enlarged thyroid, (-) neck pain. Respiratory: - cough, - shortness of breath, - wheezing. Neurologic: - numbness, - tingling. Psychiatric: - anxiety, - depression Sleep behavior: -sleep paralysis -hypnogogic hallucinations -dream enactment      -vivid dreams -cataplexy -night terrors -sleep walking   Current Medication: Outpatient Encounter Medications as of 12/26/2024  Medication Sig   dapagliflozin propanediol (FARXIGA) 10 MG TABS tablet Take by mouth daily.   famotidine (PEPCID) 20 MG tablet Take 20 mg by mouth  daily.   gabapentin (NEURONTIN) 100 MG capsule Take 100-300 mg by mouth.   magnesium oxide (MAG-OX) 400 (240 Mg) MG tablet Take 400 mg by mouth daily.   meloxicam (MOBIC) 15 MG tablet Take 1 tablet by mouth daily.   Multiple Vitamin (MULTIVITAMIN PO) Take by mouth daily.   sodium bicarbonate 650 MG tablet Take 1,300 mg by mouth 2 (two) times daily.   [DISCONTINUED] tamsulosin (FLOMAX) 0.4 MG CAPS capsule Take 0.4 mg by mouth daily.   Biotin 89999 MCG TABS Take 1 tablet by mouth daily.   omeprazole (PRILOSEC) 10 MG capsule Take 10 mg by mouth daily.   [DISCONTINUED] predniSONE  (STERAPRED UNI-PAK 21 TAB) 10 MG (21) TBPK tablet Take 6 tablets x 2 days, 5 tablets x 2 days, 4 tablets x 2 days, 3 tablets x 2 days, 2 tablets x 2 days, 1 tablet x 2 days   No facility-administered encounter medications on file as of 12/26/2024.    Surgical History: Past Surgical History:  Procedure Laterality Date   BLEPHAROPLASTY     CHOLECYSTECTOMY     CYSTOURETHROSCOPY     EYE SURGERY     MELANOMA EXCISION     STRABISMUS SURGERY Right 06/26/2023   Procedure: STRABISMUS REPAIR RIGHT EYE;  Surgeon: Tobie Factor, MD;  Location: [facility];  Service: Ophthalmology;  Laterality: Right;    Medical History: Past Medical History:  Diagnosis Date   GERD (gastroesophageal reflux disease)    History of kidney stones    Hypertension    Peripheral edema    Seasonal allergies  Family History: Non contributory to the present illness  Social History: Social History   Socioeconomic History   Marital status: Widowed    Spouse name: Not on file   Number of children: Not on file   Years of education: Not on file   Highest education level: Not on file  Occupational History   Not on file  Tobacco Use   Smoking status: Never   Smokeless tobacco: Never  Vaping Use   Vaping status: Never Used  Substance and Sexual Activity   Alcohol use: Never   Drug use: Never   Sexual activity: Not on file   Other Topics Concern   Not on file  Social History Narrative   Not on file   Social Drivers of Health   Tobacco Use: Low Risk (12/26/2024)   Patient History    Smoking Tobacco Use: Never    Smokeless Tobacco Use: Never    Passive Exposure: Not on file  Financial Resource Strain: Low Risk  (06/04/2024)   Received from [facility]   Overall Financial Resource Strain (CARDIA)    Difficulty of Paying Living Expenses: Not very hard  Food Insecurity: No Food Insecurity (06/04/2024)   Received from [facility]   Epic    Within the past 12 months, you worried that your food would run out before you got the money to buy more.: Never true    Within the past 12 months, the food you bought just didn't last and you didn't have money to get more.: Never true  Transportation Needs: No Transportation Needs (06/04/2024)   Received from [facility] - Transportation    In the past 12 months, has lack of transportation kept you from medical appointments or from getting medications?: No    Lack of Transportation (Non-Medical): No  Physical Activity: Not on file  Stress: Not on file  Social Connections: Not on file  Intimate Partner Violence: Not on file  Depression (EYV7-0): Not on file  Alcohol Screen: Not on file  Housing: Low Risk  (06/04/2024)   Received from [facility]   Epic    In the last 12 months, was there a time when you were not able to pay the mortgage or rent on time?: No    In the past 12 months, how many times have you moved where you were living?: 0    At any time in the past 12 months, were you homeless or living in a shelter (including now)?: No  Utilities: Not At Risk (06/04/2024)   Received from [facility]   Epic    In the past 12 months has the electric, gas, oil, or water company threatened to shut off services in your home?: No  Health Literacy: Not on file    Vital  Signs: Blood pressure (!) 157/90, pulse (!) 16, resp. rate 16, height 5' (1.524 m), weight 242 lb 3.2 oz (109.9 kg), SpO2 97%. Body mass index is 47.3 kg/m.   Examination: General Appearance: The patient is well-developed, well-nourished, and in no distress. Neck Circumference: 40cm Skin: Gross inspection of skin unremarkable. Head: normocephalic, no gross deformities. Eyes: no gross deformities noted. ENT: ears appear grossly normal Neurologic: Alert and oriented. No involuntary movements.    STOP BANG RISK ASSESSMENT S (snore) Have you been told that you snore?     NO   T (tired) Are you often tired, fatigued, or sleepy during the day?  NO  O (obstruction) Do you stop breathing, choke, or gasp during sleep? NO   P (pressure) Do you have or are you being treated for high blood pressure? NO   B (BMI) Is your body index greater than 35 kg/m? YES   A (age) Are you 14 years old or older? YES   N (neck) Do you have a neck circumference greater than 16 inches?   NO   G (gender) Are you a female? NO   TOTAL STOP/BANG YES ANSWERS 2                                                               A STOP-Bang score of 2 or less is considered low risk, and a score of 5 or more is high risk for having either moderate or severe OSA. For people who score 3 or 4, doctors may need to perform further assessment to determine how likely they are to have OSA.         EPWORTH SLEEPINESS SCALE:  Scale:  (0)= no chance of dozing; (1)= slight chance of dozing; (2)= moderate chance of dozing; (3)= high chance of dozing  Chance  Situtation    Sitting and reading:1      Watching TV: 2    Sitting Inactive in public: 0    As a passenger in car: 1     Lying down to rest: 0    Sitting and talking: 0    Sitting quielty after lunch: 1    In a car, stopped in traffic: 1   TOTAL SCORE:   5 out of 24    SLEEP STUDIES:  PSG (06/2024) AHI 16.9, Supine 19.3/hr, Min Sp02 83% Titration  (07/2024) CPAP@ 12 cmH20   COMPLIANCE DATA: Date Range: 11/23/2024 - 12/22/2024   Average Daily Use: 6 hours 51 minutes   Median Use: 7 hours 15 minutes   Compliance for > 4 Hours: 87% days   AHI: 3.5 respiratory events per hour   Days Used: 28/30   Mask Leak: 5.4   95th Percentile Pressure: 12 cmH20  LABS: No results found for this or any previous visit (from the past 2160 hours).  Radiology: MM 3D SCREENING MAMMOGRAM BILATERAL BREAST Result Date: 10/11/2024 CLINICAL DATA:  Screening. EXAM: DIGITAL SCREENING BILATERAL MAMMOGRAM WITH TOMOSYNTHESIS AND CAD TECHNIQUE: Bilateral screening digital craniocaudal and mediolateral oblique mammograms were obtained. Bilateral screening digital breast tomosynthesis was performed. The images were evaluated with computer-aided detection. COMPARISON:  Previous exam(s). ACR Breast Density Category a: The breasts are almost entirely fatty. FINDINGS: There are no findings suspicious for malignancy. IMPRESSION: No mammographic evidence of malignancy. A result letter of this screening mammogram will be mailed directly to the patient. RECOMMENDATION: Screening mammogram in one year. (Code:SM-B-01Y) BI-RADS CATEGORY  1: Negative. Electronically Signed   By: Curtistine Noble   On: 10/11/2024 11:38    No results found.  No results found.    Assessment and Plan: Patient Active Problem List   Diagnosis Date Noted   OSA (obstructive sleep apnea) 12/26/2024   CPAP use counseling 12/26/2024   Morbid obesity (HCC) 12/26/2024   Essential hypertension 02/17/2022   GERD (gastroesophageal reflux disease) 11/13/2020   1. OSA (obstructive sleep apnea) (Primary) Patient evaluation reveals moderate sleep apnea controlled  on 12 cm CPAP. She is feeling benefit, and is tolerating the therapy well. Her apnea is controlled.  Recommend: continue with very good compliance. F/u 58m.  2. CPAP use counseling CPAP Counseling: had a lengthy discussion with the  patient regarding the importance of PAP therapy in management of the sleep apnea. Patient appears to understand the risk factor reduction and also understands the risks associated with untreated sleep apnea. Patient will try to make a good faith effort to remain compliant with therapy. Also instructed the patient on proper cleaning of the device including the water must be changed daily if possible and use of distilled water is preferred. Patient understands that the machine should be regularly cleaned with appropriate recommended cleaning solutions that do not damage the PAP machine for example given white vinegar and water rinses. Other methods such as ozone treatment may not be as good as these simple methods to achieve cleaning.   3. Morbid obesity (HCC) Obesity Counseling: Had a lengthy discussion regarding patients BMI and weight issues. Patient was instructed on portion control as well as increased activity. Also discussed caloric restrictions with trying to maintain intake less than 2000 Kcal. Discussions were made in accordance with the 5As of weight management. Simple actions such as not eating late and if able to, taking a walk is suggested.    General Counseling: I have discussed the findings of the evaluation and examination with Barnie.  I have also discussed any further diagnostic evaluation thatmay be needed or ordered today. Ruey verbalizes understanding of the findings of todays visit. We also reviewed her medications today and discussed drug interactions and side effects including but not limited excessive drowsiness and altered mental states. We also discussed that there is always a risk not just to her but also people around her. she has been encouraged to call the office with any questions or concerns that should arise related to todays visit.  No orders of the defined types were placed in this encounter.       I have personally obtained a history, evaluated the patient,  evaluated pertinent data, formulated the assessment and plan and placed orders.   This patient was seen today by Lauraine Lay, PA-C in collaboration with Dr. Elfreda Bathe.   Elfreda DELENA Bathe, MD [facility] Diplomate ABMS Pulmonary and Critical Care Medicine Sleep medicine

## 2024-12-26 ENCOUNTER — Ambulatory Visit: Admitting: Internal Medicine

## 2024-12-26 VITALS — BP 157/90 | HR 16 | Resp 16 | Ht 60.0 in | Wt 242.2 lb

## 2024-12-26 DIAGNOSIS — G4733 Obstructive sleep apnea (adult) (pediatric): Secondary | ICD-10-CM | POA: Diagnosis not present

## 2024-12-26 DIAGNOSIS — Z7189 Other specified counseling: Secondary | ICD-10-CM

## 2024-12-26 NOTE — Patient Instructions (Signed)
